# Patient Record
Sex: Male | Born: 1995 | Race: White | Hispanic: No | Marital: Single | Smoking: Current every day smoker
Health system: Southern US, Community
[De-identification: ages and names within clinical notes are randomized; demographics above are authoritative.]

## PROBLEM LIST (undated history)

## (undated) DIAGNOSIS — J45909 Unspecified asthma, uncomplicated: Secondary | ICD-10-CM

## (undated) DIAGNOSIS — F909 Attention-deficit hyperactivity disorder, unspecified type: Secondary | ICD-10-CM

---
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
## 2016-01-20 ENCOUNTER — Emergency Department
Admission: EM | Admit: 2016-01-20 | Discharge: 2016-01-20 | Disposition: A | Discharge status: Home / Self Care | Payer: Medicaid Other | Attending: Emergency Medicine | Admitting: Emergency Medicine

## 2016-01-20 ENCOUNTER — Encounter: Payer: Self-pay | Admitting: Emergency Medicine

## 2016-01-20 DIAGNOSIS — X58XXXA Exposure to other specified factors, initial encounter: Secondary | ICD-10-CM | POA: Diagnosis not present

## 2016-01-20 DIAGNOSIS — T781XXA Other adverse food reactions, not elsewhere classified, initial encounter: Secondary | ICD-10-CM | POA: Diagnosis not present

## 2016-01-20 DIAGNOSIS — F17210 Nicotine dependence, cigarettes, uncomplicated: Secondary | ICD-10-CM | POA: Diagnosis not present

## 2016-01-20 DIAGNOSIS — Y998 Other external cause status: Secondary | ICD-10-CM | POA: Insufficient documentation

## 2016-01-20 DIAGNOSIS — Y9289 Other specified places as the place of occurrence of the external cause: Secondary | ICD-10-CM | POA: Diagnosis not present

## 2016-01-20 DIAGNOSIS — L500 Allergic urticaria: Secondary | ICD-10-CM | POA: Diagnosis not present

## 2016-01-20 DIAGNOSIS — Y9389 Activity, other specified: Secondary | ICD-10-CM | POA: Diagnosis not present

## 2016-01-20 MED ORDER — DIPHENHYDRAMINE HCL 25 MG PO CAPS
25.0000 mg | ORAL_CAPSULE | Freq: Once | ORAL | Status: AC
  Administered 2016-01-20: 25 mg via ORAL
  Filled 2016-01-20: qty 1

## 2016-01-20 MED ORDER — FAMOTIDINE 20 MG PO TABS
20.0000 mg | ORAL_TABLET | Freq: Once | ORAL | Status: AC
  Administered 2016-01-20: 20 mg via ORAL
  Filled 2016-01-20: qty 1

## 2016-01-20 NOTE — ED Notes (Addendum)
Pt says he cooked and ate some crab meat within the last hour; started having hives to his neck and itching; no difficulty swallowing; took 1 tsp benadryl suspension before coming to ED

## 2016-01-26 NOTE — ED Provider Notes (Signed)
-----------------------------------------   5:27 AM on 01/26/2016 -----------------------------------------  I received a notification in [facility] that I needed to do a provider note for this patient, but I never signed up for him and he left without being seen from the lobby.  I never participated directly in his care nor was I aware of his presence in our emergency department.  Loleta Rose, MD 01/26/16 [phone]

## 2016-04-10 ENCOUNTER — Emergency Department
Admission: EM | Admit: 2016-04-10 | Discharge: 2016-04-10 | Disposition: A | Discharge status: Home / Self Care | Payer: Medicaid Other | Attending: Emergency Medicine | Admitting: Emergency Medicine

## 2016-04-10 ENCOUNTER — Encounter: Payer: Self-pay | Admitting: Emergency Medicine

## 2016-04-10 ENCOUNTER — Emergency Department: Payer: Medicaid Other

## 2016-04-10 DIAGNOSIS — F322 Major depressive disorder, single episode, severe without psychotic features: Secondary | ICD-10-CM | POA: Insufficient documentation

## 2016-04-10 DIAGNOSIS — Y999 Unspecified external cause status: Secondary | ICD-10-CM | POA: Insufficient documentation

## 2016-04-10 DIAGNOSIS — S0993XA Unspecified injury of face, initial encounter: Secondary | ICD-10-CM | POA: Diagnosis present

## 2016-04-10 DIAGNOSIS — S0083XA Contusion of other part of head, initial encounter: Secondary | ICD-10-CM

## 2016-04-10 DIAGNOSIS — F17210 Nicotine dependence, cigarettes, uncomplicated: Secondary | ICD-10-CM | POA: Diagnosis not present

## 2016-04-10 DIAGNOSIS — Y929 Unspecified place or not applicable: Secondary | ICD-10-CM | POA: Diagnosis not present

## 2016-04-10 DIAGNOSIS — X500XXA Overexertion from strenuous movement or load, initial encounter: Secondary | ICD-10-CM | POA: Insufficient documentation

## 2016-04-10 DIAGNOSIS — F909 Attention-deficit hyperactivity disorder, unspecified type: Secondary | ICD-10-CM | POA: Diagnosis not present

## 2016-04-10 DIAGNOSIS — Y9389 Activity, other specified: Secondary | ICD-10-CM | POA: Diagnosis not present

## 2016-04-10 MED ORDER — IBUPROFEN 800 MG PO TABS
800.0000 mg | ORAL_TABLET | Freq: Three times a day (TID) | ORAL | Status: AC | PRN
Start: 2016-04-10 — End: ?

## 2016-04-10 NOTE — Discharge Instructions (Signed)

## 2016-04-10 NOTE — ED Provider Notes (Signed)
[facility] Emergency Department Provider Note  ____________________________________________  Time seen: Approximately 4:20 PM  I have reviewed the triage vital signs and the nursing notes.   HISTORY  Chief Complaint Jaw Pain    HPI Jeff Gonzalez is a 20 y.o. male complaining of jaw pain. Patient states he is getting some cinder blocks yesterday and his self on the left side of the jaw with increased pain. He states that he was lifting cinder blocks about 6 at a time when he hit himself in the face. Complains of increased pain to his left lower jaw and when he opens his mouth to chew.   Past Medical History  Diagnosis Date  . ADHD (attention deficit hyperactivity disorder)     Patient Active Problem List   Diagnosis Date Noted  . MDD (major depressive disorder), single episode, severe , no psychosis (HCC) 08/22/2015    History reviewed. No pertinent past surgical history.  Current Outpatient Rx  Name  Route  Sig  Dispense  Refill  . citalopram (CELEXA) 10 MG tablet   Oral   Take 1 tablet (10 mg total) by mouth daily.   30 tablet   0   . ibuprofen (ADVIL,MOTRIN) 800 MG tablet   Oral   Take 1 tablet (800 mg total) by mouth every 8 (eight) hours as needed.   30 tablet   0   . nicotine (NICODERM CQ - DOSED IN MG/24 HOURS) 21 mg/24hr patch   Transdermal   Place 1 patch (21 mg total) onto the skin daily.   28 patch   0     Allergies Augmentin  No family history on file.  Social History Social History  Substance Use Topics  . Smoking status: Current Every Day Smoker    Types: Cigarettes  . Smokeless tobacco: None  . Alcohol Use: No    Review of Systems Constitutional: No fever/chills ENT: Positive for left lower jaw pain.. Skin: Negative for rash. Neurological: Negative for headaches, focal weakness or numbness.  10-point ROS otherwise negative.  ____________________________________________   PHYSICAL EXAM:  VITAL  SIGNS: ED Triage Vitals  Enc Vitals Group     BP 04/10/16 1618 122/68 mmHg     Pulse Rate 04/10/16 1618 78     Resp 04/10/16 1618 18     Temp 04/10/16 1618 98.2 F (36.8 C)     Temp Source 04/10/16 1618 Oral     SpO2 04/10/16 1618 98 %     Weight 04/10/16 1618 174 lb (78.926 kg)     Height 04/10/16 1618  (1.727 m)     Head Cir --      Peak Flow --      Pain Score 04/10/16 1617 6     Pain Loc --      Pain Edu? --      Excl. in GC? --     Constitutional: Alert and oriented. Well appearing and in no acute distress. Mouth/Throat: Mucous membranes are moist.  Oropharynx non-erythematous.Positive tenderness and swelling to left lower jaw. Obvious dental caries within the mouth itself. Neck: No stridor.  FullRange of motion nontender Neurologic:  Normal speech and language. No gross focal neurologic deficits are appreciated. No gait instability. Skin:  Skin is warm, dry and intact. No rash noted. Psychiatric: Mood and affect are normal. Speech and behavior are normal.  ____________________________________________   LABS (all labs ordered are listed, but only abnormal results are displayed)  Labs Reviewed - No data  to display ____________________________________________   RADIOLOGY  IMPRESSION: Incidentally detected moderate chronic appearing right maxillary sinusitis. No acute findings. ____________________________________________   PROCEDURES  Procedure(s) performed: None  Critical Care performed: No  ____________________________________________   INITIAL IMPRESSION / ASSESSMENT AND PLAN / ED COURSE  Pertinent labs & imaging results that were available during my care of the patient were reviewed by me and considered in my medical decision making (see chart for details).  Acute maxillofacial contusion. Reassurance provided to the patient Rx given for ibuprofen 800 mg 3 times a day to take as needed. Patient follow-up with PCP or return when necessary any  worsening symptomology. Work excuse 24 hours given. ____________________________________________   FINAL CLINICAL IMPRESSION(S) / ED DIAGNOSES  Final diagnoses:  Facial contusion, initial encounter     This chart was dictated using voice recognition software/Dragon. Despite best efforts to proofread, errors can occur which can change the meaning. Any change was purely unintentional.   Evangeline Dakinharles M Anjeli Casad, PA-C 04/10/16 1705  Jene Everyobert Kinner, MD 04/10/16 [phone]

## 2016-04-10 NOTE — ED Notes (Signed)
States he was carrying some cinder blocks yesterday and hit self on left side of jaw  Increased pain with chewing no deformity of jaw noted at present

## 2016-08-09 ENCOUNTER — Emergency Department (HOSPITAL_COMMUNITY): Payer: Medicaid Other

## 2016-08-09 ENCOUNTER — Encounter (HOSPITAL_COMMUNITY): Payer: Self-pay | Admitting: Emergency Medicine

## 2016-08-09 DIAGNOSIS — J45909 Unspecified asthma, uncomplicated: Secondary | ICD-10-CM | POA: Diagnosis not present

## 2016-08-09 DIAGNOSIS — F902 Attention-deficit hyperactivity disorder, combined type: Secondary | ICD-10-CM | POA: Diagnosis not present

## 2016-08-09 DIAGNOSIS — Y999 Unspecified external cause status: Secondary | ICD-10-CM | POA: Insufficient documentation

## 2016-08-09 DIAGNOSIS — F17210 Nicotine dependence, cigarettes, uncomplicated: Secondary | ICD-10-CM | POA: Insufficient documentation

## 2016-08-09 DIAGNOSIS — Y929 Unspecified place or not applicable: Secondary | ICD-10-CM | POA: Insufficient documentation

## 2016-08-09 DIAGNOSIS — S0990XA Unspecified injury of head, initial encounter: Secondary | ICD-10-CM | POA: Diagnosis present

## 2016-08-09 DIAGNOSIS — Y939 Activity, unspecified: Secondary | ICD-10-CM | POA: Diagnosis not present

## 2016-08-09 DIAGNOSIS — S0083XA Contusion of other part of head, initial encounter: Secondary | ICD-10-CM | POA: Insufficient documentation

## 2016-08-09 DIAGNOSIS — W208XXA Other cause of strike by thrown, projected or falling object, initial encounter: Secondary | ICD-10-CM | POA: Insufficient documentation
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
## 2016-08-10 ENCOUNTER — Emergency Department (HOSPITAL_COMMUNITY)
Admission: EM | Admit: 2016-08-10 | Discharge: 2016-08-10 | Disposition: A | Discharge status: Home / Self Care | Payer: Medicaid Other | Attending: Emergency Medicine | Admitting: Emergency Medicine

## 2016-08-10 ENCOUNTER — Encounter (HOSPITAL_COMMUNITY): Payer: Self-pay | Admitting: Radiology

## 2016-08-10 ENCOUNTER — Emergency Department (HOSPITAL_COMMUNITY): Payer: Medicaid Other

## 2016-08-10 DIAGNOSIS — S0083XA Contusion of other part of head, initial encounter: Secondary | ICD-10-CM

## 2016-08-10 HISTORY — DX: Unspecified asthma, uncomplicated: J45.909

## 2016-08-10 MED ORDER — TRAMADOL HCL 50 MG PO TABS
50.0000 mg | ORAL_TABLET | Freq: Once | ORAL | Status: AC
  Administered 2016-08-10: 50 mg via ORAL
  Filled 2016-08-10: qty 1

## 2016-08-10 MED ORDER — NAPROXEN 500 MG PO TABS: 500.0000 mg | ORAL_TABLET | Freq: Two times a day (BID) | ORAL | 0 refills | Status: AC

## 2016-08-10 MED ORDER — OXYCODONE-ACETAMINOPHEN 5-325 MG PO TABS: 1.0000 | ORAL_TABLET | Freq: Four times a day (QID) | ORAL | 0 refills | Status: AC | PRN

## 2016-08-10 NOTE — ED Notes (Signed)
Family at bedside. 
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
## 2016-08-10 NOTE — ED Notes (Signed)
Patient is alert and orientedx4.  Patient was explained discharge instructions and they understood them with no questions.  The patient's fiancee, Adrian PrinceBryana Cairo is taking the patient home.

## 2016-08-10 NOTE — ED Notes (Signed)
Pt refusing CT because his wife can't be in the CT scanner with him. Pt walked outside with wife. Did not inform staff if he was refusing anymore treatment.

## 2016-10-27 IMAGING — CT CT MAXILLOFACIAL W/O CM
3 series · 16 of 47 positions shown, 19 images · non-contrast
Comparison: None.

CLINICAL DATA: carrying some cinder blocks yesterday and hit self
on left side of jaw Increased pain with chewing no deformity of jaw
noted at present.

EXAM:
CT MAXILLOFACIAL WITHOUT CONTRAST
TECHNIQUE: Multidetector CT imaging of the maxillofacial structures was
performed. Multiplanar CT image reconstructions were also generated.
A small metallic BB was placed on the right temple in order to
reliably differentiate right from left.

[Series 2: max soft · axial · 0.35mm/px · z∈[-68,+70]mm · 10 of 81 slices shown, 13 images]
[im 6/81  brain]
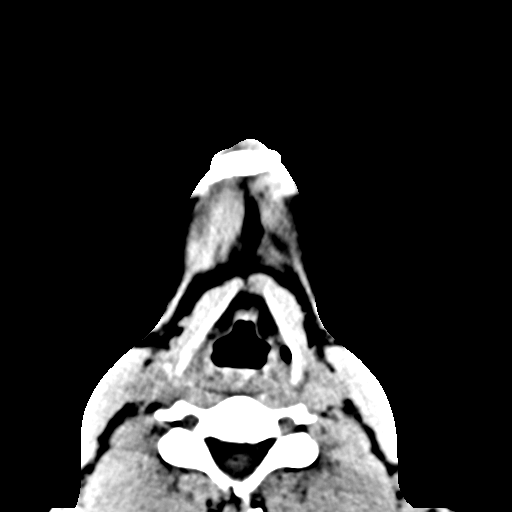
[im 6/81  bone]
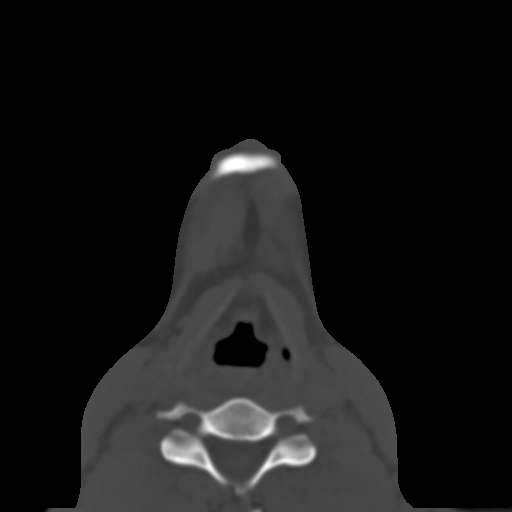
[im 14/81  bone]
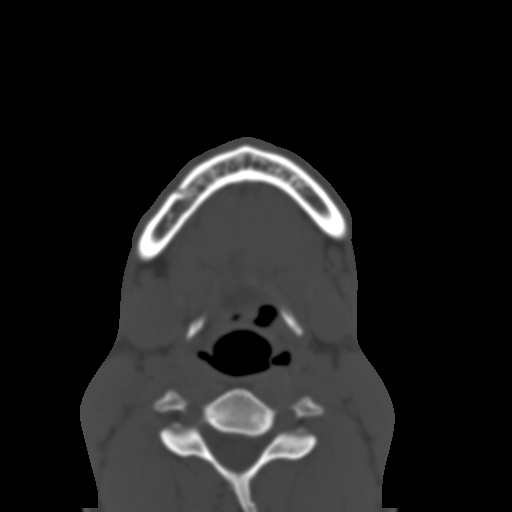
[im 23/81  bone]
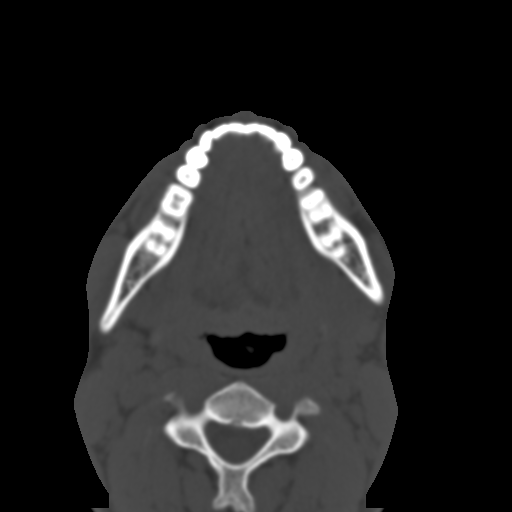
[im 28/81  bone]
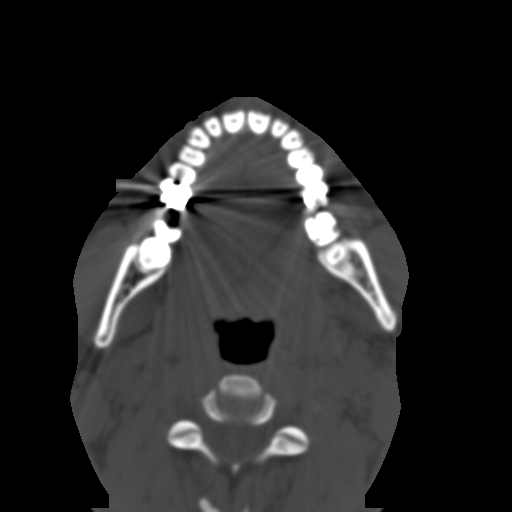
[im 36/81  brain]
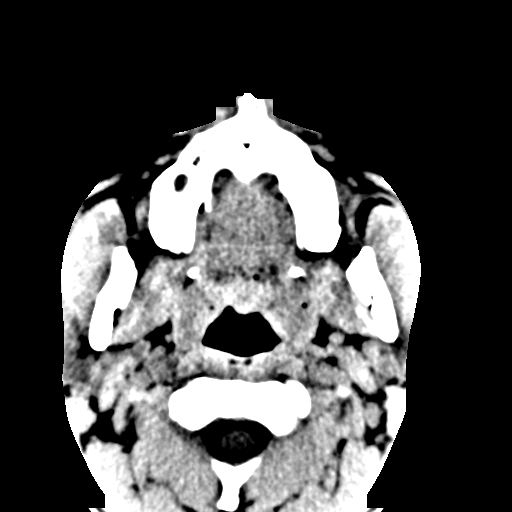
[im 36/81  bone]
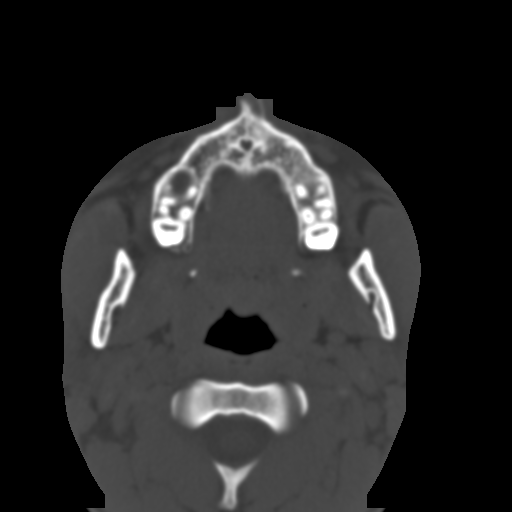
[im 45/81  bone]
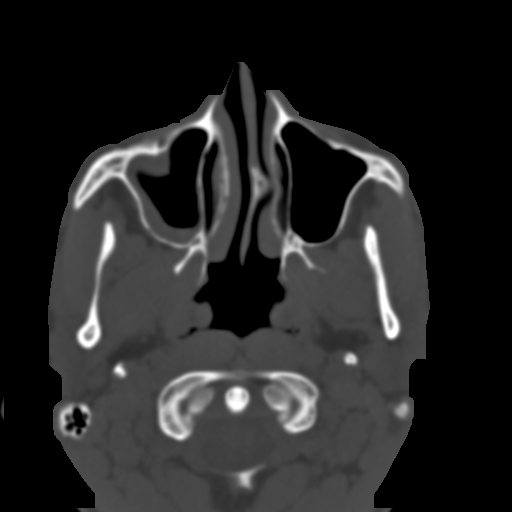
[im 53/81  bone]
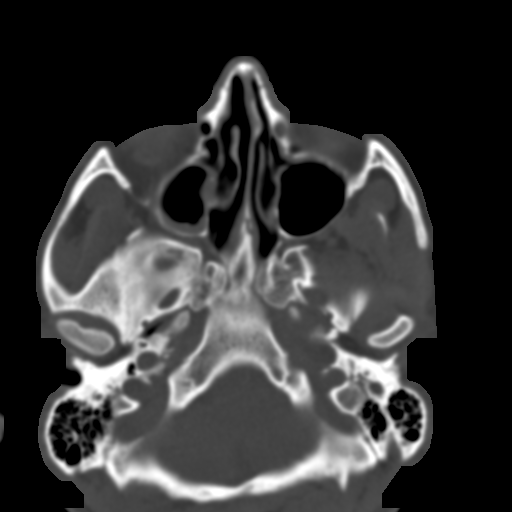
[im 61/81  bone]
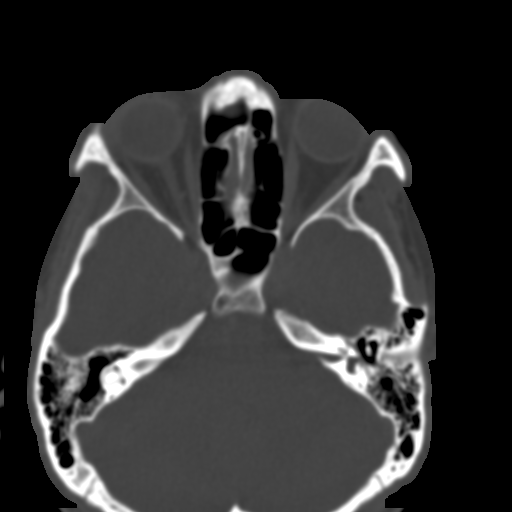
[im 67/81  brain]
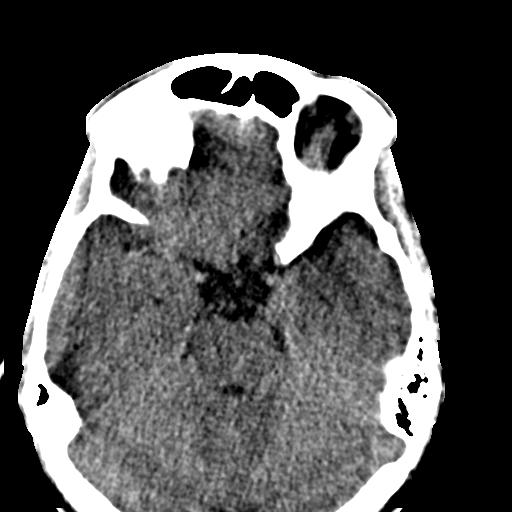
[im 67/81  bone]
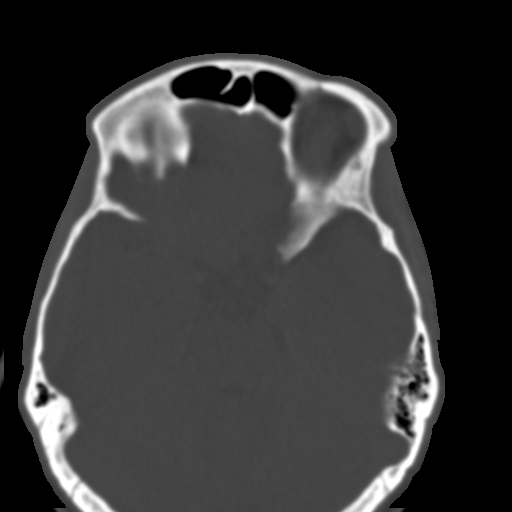
[im 75/81  bone]
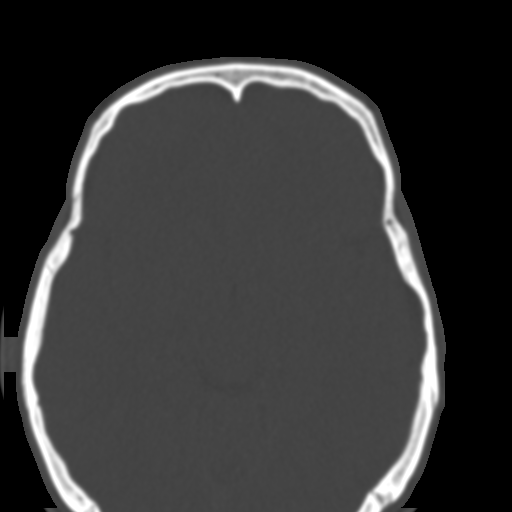

[Series 4: coronal soft · coronal · 0.33mm/px · 3 of 83 slices shown]
[im 28/83  bone]
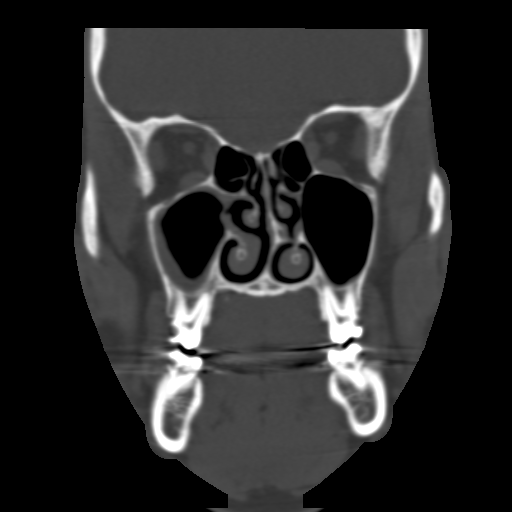
[im 37/83  bone]
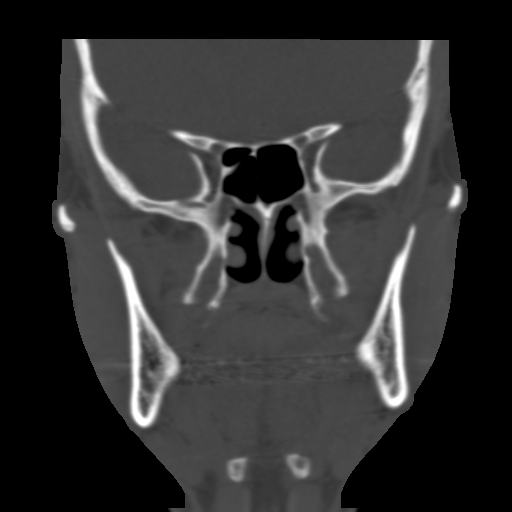
[im 46/83  bone]
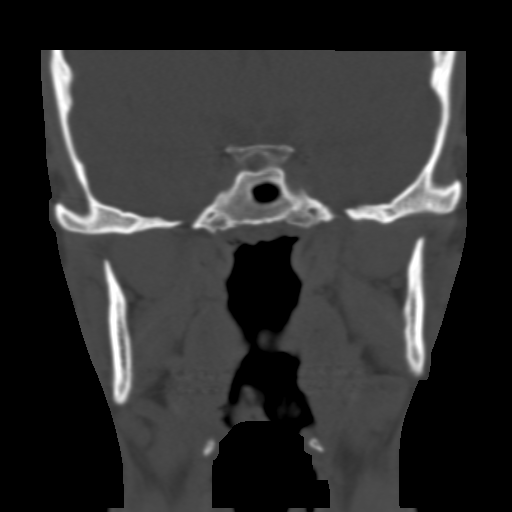

[Series 5: sagittal soft · sagittal · 0.33mm/px · 3 of 79 slices shown]
[im 27/79  bone]
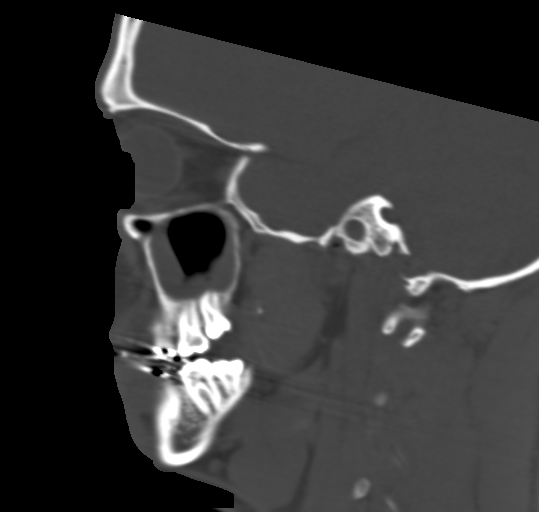
[im 40/79  bone]
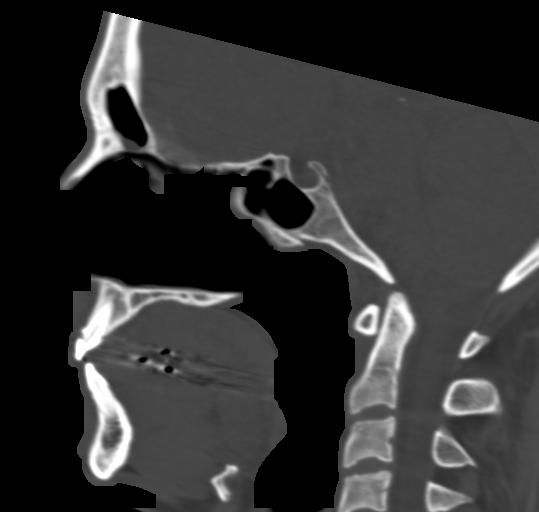
[im 53/79  bone]
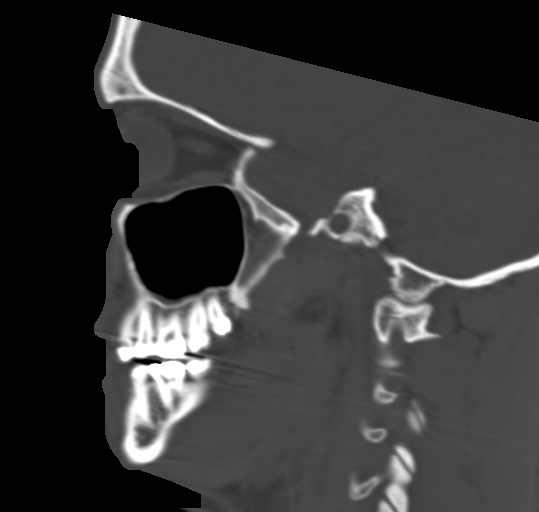

[16 of 47 positions shown; findings below may reference images not displayed]

FINDINGS: Moderate inflammatory change right maxillary sinuses not appear
acute. Remainder of the sinuses appear clear. No evidence of
mandibular fracture. Zygomas are intact. Both orbits are intact. No
evidence nasal bone fracture. No acute soft tissue abnormalities.
IMPRESSION: Incidentally detected moderate chronic appearing right maxillary
sinusitis. No acute findings.

## 2017-01-01 ENCOUNTER — Encounter (HOSPITAL_COMMUNITY): Payer: Self-pay

## 2017-01-01 ENCOUNTER — Emergency Department (HOSPITAL_COMMUNITY): Payer: Medicaid Other

## 2017-01-01 ENCOUNTER — Emergency Department (HOSPITAL_COMMUNITY)
Admission: EM | Admit: 2017-01-01 | Discharge: 2017-01-02 | Disposition: A | Discharge status: Home / Self Care | Payer: Medicaid Other | Attending: Emergency Medicine | Admitting: Emergency Medicine

## 2017-01-01 DIAGNOSIS — F909 Attention-deficit hyperactivity disorder, unspecified type: Secondary | ICD-10-CM | POA: Insufficient documentation

## 2017-01-01 DIAGNOSIS — K2970 Gastritis, unspecified, without bleeding: Secondary | ICD-10-CM | POA: Insufficient documentation

## 2017-01-01 DIAGNOSIS — J45909 Unspecified asthma, uncomplicated: Secondary | ICD-10-CM | POA: Diagnosis not present

## 2017-01-01 DIAGNOSIS — F17210 Nicotine dependence, cigarettes, uncomplicated: Secondary | ICD-10-CM | POA: Insufficient documentation

## 2017-01-01 DIAGNOSIS — K2900 Acute gastritis without bleeding: Secondary | ICD-10-CM

## 2017-01-01 DIAGNOSIS — R1013 Epigastric pain: Secondary | ICD-10-CM | POA: Diagnosis present

## 2017-01-01 LAB — COMPREHENSIVE METABOLIC PANEL
ALBUMIN: 3.9 g/dL (ref 3.5–5.0)
ALT: 20 U/L (ref 17–63)
AST: 23 U/L (ref 15–41)
Alkaline Phosphatase: 71 U/L (ref 38–126)
Anion gap: 7 (ref 5–15)
BUN: 7 mg/dL (ref 6–20)
CHLORIDE: 107 mmol/L (ref 101–111)
CO2: 26 mmol/L (ref 22–32)
Calcium: 8.7 mg/dL — ABNORMAL LOW (ref 8.9–10.3)
Creatinine, Ser: 0.94 mg/dL (ref 0.61–1.24)
GFR calc Af Amer: >60 mL/min (ref >60)
GFR calc non Af Amer: >60 mL/min (ref >60)
GLUCOSE: 103 mg/dL — AB (ref 65–99)
POTASSIUM: 4 mmol/L (ref 3.5–5.1)
Sodium: 140 mmol/L (ref 135–145)
Total Bilirubin: 0.4 mg/dL (ref 0.3–1.2)
Total Protein: 6.4 g/dL — ABNORMAL LOW (ref 6.5–8.1)

## 2017-01-01 LAB — CBC
HEMATOCRIT: 40 % (ref 39.0–52.0)
Hemoglobin: 13.4 g/dL (ref 13.0–17.0)
MCH: 28.8 pg (ref 26.0–34.0)
MCHC: 33.5 g/dL (ref 30.0–36.0)
MCV: 86 fL (ref 78.0–100.0)
Platelets: 153 10*3/uL (ref 150–400)
RBC: 4.65 MIL/uL (ref 4.22–5.81)
RDW: 13.5 % (ref 11.5–15.5)
WBC: 3.8 10*3/uL — AB (ref 4.0–10.5)

## 2017-01-01 LAB — URINALYSIS, ROUTINE W REFLEX MICROSCOPIC
Bilirubin Urine: NEGATIVE
GLUCOSE, UA: NEGATIVE mg/dL
Hgb urine dipstick: NEGATIVE
KETONES UR: NEGATIVE mg/dL
LEUKOCYTES UA: NEGATIVE
Nitrite: NEGATIVE
PH: 6 (ref 5.0–8.0)
Protein, ur: NEGATIVE mg/dL
Specific Gravity, Urine: 1.024 (ref 1.005–1.030)

## 2017-01-01 LAB — LIPASE, BLOOD: LIPASE: 24 U/L (ref 11–51)

## 2017-01-01 LAB — RAPID STREP SCREEN (MED CTR MEBANE ONLY): Streptococcus, Group A Screen (Direct): NEGATIVE

## 2017-01-01 MED ORDER — ONDANSETRON 4 MG PO TBDP
4.0000 mg | ORAL_TABLET | Freq: Once | ORAL | Status: AC
  Administered 2017-01-01: 4 mg via ORAL
  Filled 2017-01-01: qty 1

## 2017-01-01 MED ORDER — GI COCKTAIL ~~LOC~~
30.0000 mL | Freq: Once | ORAL | Status: AC
  Administered 2017-01-01: 30 mL via ORAL
  Filled 2017-01-01: qty 30

## 2017-01-01 MED ORDER — ONDANSETRON HCL 4 MG PO TABS: 4.0000 mg | ORAL_TABLET | Freq: Four times a day (QID) | ORAL | 0 refills | Status: AC

## 2017-01-01 MED ORDER — OXYCODONE-ACETAMINOPHEN 5-325 MG PO TABS
1.0000 | ORAL_TABLET | Freq: Once | ORAL | Status: AC
  Administered 2017-01-01: 1 via ORAL
  Filled 2017-01-01: qty 1

## 2017-01-01 MED ORDER — OMEPRAZOLE 20 MG PO CPDR: 20.0000 mg | DELAYED_RELEASE_CAPSULE | Freq: Every day | ORAL | 0 refills | Status: AC

## 2017-01-01 NOTE — ED Notes (Signed)
Patient transported to X-ray 

## 2017-01-01 NOTE — ED Notes (Signed)
Pt. returned from XR. 

## 2017-01-01 NOTE — ED Notes (Addendum)
Pt denies N/V/D or bloody stools but reports one episode of hemoptysis ~ 2000 tonight. Pt reports moderate amount of bright red blood.

## 2017-01-01 NOTE — ED Provider Notes (Signed)
[facility] Provider Note   CSN: [id number] Arrival date & time: 01/01/17  2005     History   Chief Complaint Chief Complaint  Patient presents with  . Abdominal Pain  . Hematemesis    HPI Jeff Gonzalez is a 21 y.o. male.  The history is provided by the patient. No language interpreter was used.   Jeff Gonzalez is a 21 y.o. male who presents to the Emergency Department complaining of Abdominal pain and vomiting. He reports 3 days if sharp and stabbing epigastric abdominal pain that radiates throughout his abdomen. Pain is constant but worse with walking and eating. Today he had a coughing episode and coughed up a small volume of blood that was bright red, no clots or mucus mixed in. The amount of blood filled the palm of her hand. Eyes any vomiting or diarrhea. No melena or hematochezia. No hematuria. He is a smoker and has a history history of asthma, no other medical problems. He has taken Tylenol, ibuprofen and Goody powders for the pain. He does not drink alcohol or do street drugs. Past Medical History:  Diagnosis Date  . ADHD (attention deficit hyperactivity disorder)   . Asthma     Patient Active Problem List   Diagnosis Date Noted  . MDD (major depressive disorder), single episode, severe , no psychosis (HCC) 08/22/2015    History reviewed. No pertinent surgical history.     Home Medications    Prior to Admission medications   Medication Sig Start Date End Date Taking? Authorizing Provider  ibuprofen (ADVIL,MOTRIN) 800 MG tablet Take 1 tablet (800 mg total) by mouth every 8 (eight) hours as needed. Patient not taking: Reported on 08/10/2016 04/10/16   Charmayne Sheerharles M Beers, PA-C  naproxen (NAPROSYN) 500 MG tablet Take 1 tablet (500 mg total) by mouth 2 (two) times daily. 08/10/16   Tiffany Neva SeatGreene, PA-C  omeprazole (PRILOSEC) 20 MG capsule Take 1 capsule (20 mg total) by mouth daily. 01/01/17   Tilden FossaElizabeth Fannie Gathright, MD  ondansetron (ZOFRAN) 4 MG tablet Take 1 tablet  (4 mg total) by mouth every 6 (six) hours. 01/01/17   Tilden FossaElizabeth Brenae Lasecki, MD  oxyCODONE-acetaminophen (PERCOCET/ROXICET) 5-325 MG tablet Take 1 tablet by mouth every 6 (six) hours as needed for severe pain. 08/10/16   Marlon Peliffany Greene, PA-C    Family History History reviewed. No pertinent family history.  Social History Social History  Substance Use Topics  . Smoking status: Current Every Day Smoker    Packs/day: 1.00    Types: Cigarettes  . Smokeless tobacco: Never Used  . Alcohol use No     Allergies   Augmentin [amoxicillin-pot clavulanate]   Review of Systems Review of Systems  All other systems reviewed and are negative.    Physical Exam Updated Vital Signs BP 106/70   Pulse 73   Temp 97.6 F (36.4 C) (Oral)   Resp 18   Ht 5\' 9"  (1.753 m)   Wt 172 lb (78 kg)   SpO2 98%   BMI 25.40 kg/m   Physical Exam  Constitutional: He is oriented to person, place, and time. He appears well-developed and well-nourished.  HENT:  Head: Normocephalic and atraumatic.  Mouth/Throat: Oropharynx is clear and moist.  Boggy nasal mucosa, mild erythema in posterior oropharynx, small amount of white exudate in posterior oropharynx.  Neck: Neck supple.  Cardiovascular: Normal rate and regular rhythm.   No murmur heard. Pulmonary/Chest: Effort normal and breath sounds normal. No respiratory distress.  Abdominal: Soft. There is  no rebound and no guarding.  Mild epigastric abdominal tenderness  Musculoskeletal: He exhibits no edema or tenderness.  Neurological: He is alert and oriented to person, place, and time.  Skin: Skin is warm and dry.  Psychiatric: He has a normal mood and affect. His behavior is normal.  Nursing note and vitals reviewed.    ED Treatments / Results  Labs (all labs ordered are listed, but only abnormal results are displayed) Labs Reviewed  COMPREHENSIVE METABOLIC PANEL - Abnormal; Notable for the following:       Result Value   Glucose, Bld 103 (*)    Calcium  8.7 (*)    Total Protein 6.4 (*)    All other components within normal limits  CBC - Abnormal; Notable for the following:    WBC 3.8 (*)    All other components within normal limits  URINALYSIS, ROUTINE W REFLEX MICROSCOPIC - Abnormal; Notable for the following:    APPearance HAZY (*)    All other components within normal limits  RAPID STREP SCREEN (NOT AT [facility])  CULTURE, GROUP A STREP ([facility])  LIPASE, BLOOD    EKG  EKG Interpretation None       Radiology Dg Chest 2 View  Result Date: 01/01/2017 CLINICAL DATA:  Initial evaluation for acute vomiting, cough. EXAM: CHEST  2 VIEW COMPARISON:  None. FINDINGS: The heart size and mediastinal contours are within normal limits. Both lungs are clear. The visualized skeletal structures are unremarkable. IMPRESSION: No active cardiopulmonary disease. Electronically Signed   By: Rise Mu M.D.   On: 01/01/2017 23:13    Procedures Procedures (including critical care time)  Medications Ordered in ED Medications  ondansetron (ZOFRAN-ODT) disintegrating tablet 4 mg (4 mg Oral Given 01/01/17 2253)  gi cocktail (Maalox,Lidocaine,Donnatal) (30 mLs Oral Given 01/01/17 2254)  oxyCODONE-acetaminophen (PERCOCET/ROXICET) 5-325 MG per tablet 1 tablet (1 tablet Oral Given 01/01/17 2253)     Initial Impression / Assessment and Plan / ED Course  I have reviewed the triage vital signs and the nursing notes.  Pertinent labs & imaging results that were available during my care of the patient were reviewed by me and considered in my medical decision making (see chart for details).     Patient here for evaluation of abdominal pain with one episode of bloody emesis/hemoptysis. He has mild epigastric tenderness on examination with no peritoneal findings. Clinical picture is not consistent with PE, Boerhaave's esophagus, perforated viscus, cholecystitis, appendicitis. Counseled patient and family on likely gastritis. Will treat with omeprazole,  antiemetics, gentle diet with close outpatient follow-up and return precautions. Discussed the importance of close return precautions if he has worsening bleeding or worsening pain.  Final Clinical Impressions(s) / ED Diagnoses   Final diagnoses:  Acute gastritis, presence of bleeding unspecified, unspecified gastritis type    New Prescriptions Discharge Medication List as of 01/01/2017 11:54 PM    START taking these medications   Details  omeprazole (PRILOSEC) 20 MG capsule Take 1 capsule (20 mg total) by mouth daily., Starting Thu 01/01/2017, Print    ondansetron (ZOFRAN) 4 MG tablet Take 1 tablet (4 mg total) by mouth every 6 (six) hours., Starting Thu 01/01/2017, Print         Tilden Fossa, MD 01/02/17 [phone]

## 2017-01-01 NOTE — ED Triage Notes (Addendum)
Pt endorses abdominal pain x 4 days and states "I cough really hard today and spit up some bright red blood" Pt denies n/v/d. Pt took tramadol pta for pain and has been taking ibuprofen/tylenold without relief.

## 2017-01-03 LAB — CULTURE, GROUP A STREP (THRC)

## 2017-06-13 ENCOUNTER — Emergency Department
Admission: EM | Admit: 2017-06-13 | Discharge: 2017-06-14 | Disposition: A | Discharge status: Home / Self Care | Payer: Medicaid Other | Attending: Emergency Medicine | Admitting: Emergency Medicine

## 2017-06-13 ENCOUNTER — Encounter: Payer: Self-pay | Admitting: Emergency Medicine

## 2017-06-13 DIAGNOSIS — Z79899 Other long term (current) drug therapy: Secondary | ICD-10-CM | POA: Diagnosis not present

## 2017-06-13 DIAGNOSIS — F339 Major depressive disorder, recurrent, unspecified: Secondary | ICD-10-CM | POA: Insufficient documentation

## 2017-06-13 DIAGNOSIS — J45909 Unspecified asthma, uncomplicated: Secondary | ICD-10-CM | POA: Diagnosis not present

## 2017-06-13 DIAGNOSIS — F17210 Nicotine dependence, cigarettes, uncomplicated: Secondary | ICD-10-CM | POA: Diagnosis not present

## 2017-06-13 DIAGNOSIS — F329 Major depressive disorder, single episode, unspecified: Secondary | ICD-10-CM

## 2017-06-13 DIAGNOSIS — Z791 Long term (current) use of non-steroidal anti-inflammatories (NSAID): Secondary | ICD-10-CM | POA: Insufficient documentation

## 2017-06-13 DIAGNOSIS — F32A Depression, unspecified: Secondary | ICD-10-CM

## 2017-06-13 LAB — CBC
HEMATOCRIT: 42.5 % (ref 40.0–52.0)
Hemoglobin: 14.7 g/dL (ref 13.0–18.0)
MCH: 29.8 pg (ref 26.0–34.0)
MCHC: 34.5 g/dL (ref 32.0–36.0)
MCV: 86.3 fL (ref 80.0–100.0)
Platelets: 225 10*3/uL (ref 150–440)
RBC: 4.93 MIL/uL (ref 4.40–5.90)
RDW: 13.9 % (ref 11.5–14.5)
WBC: 6.5 10*3/uL (ref 3.8–10.6)

## 2017-06-13 LAB — COMPREHENSIVE METABOLIC PANEL
ALBUMIN: 4.4 g/dL (ref 3.5–5.0)
ALK PHOS: 68 U/L (ref 38–126)
ALT: 12 U/L — AB (ref 17–63)
AST: 22 U/L (ref 15–41)
Anion gap: 8 (ref 5–15)
BILIRUBIN TOTAL: 0.5 mg/dL (ref 0.3–1.2)
BUN: 9 mg/dL (ref 6–20)
CO2: 26 mmol/L (ref 22–32)
Calcium: 8.9 mg/dL (ref 8.9–10.3)
Chloride: 105 mmol/L (ref 101–111)
Creatinine, Ser: 0.84 mg/dL (ref 0.61–1.24)
GFR calc Af Amer: >60 mL/min (ref >60)
GFR calc non Af Amer: >60 mL/min (ref >60)
GLUCOSE: 88 mg/dL (ref 65–99)
POTASSIUM: 3.7 mmol/L (ref 3.5–5.1)
Sodium: 139 mmol/L (ref 135–145)
TOTAL PROTEIN: 7.5 g/dL (ref 6.5–8.1)

## 2017-06-13 LAB — ETHANOL: Alcohol, Ethyl (B): <5 mg/dL (ref <5)

## 2017-06-13 LAB — SALICYLATE LEVEL: Salicylate Lvl: <7 mg/dL (ref 2.8–30.0)

## 2017-06-13 LAB — ACETAMINOPHEN LEVEL: Acetaminophen (Tylenol), Serum: 11 ug/mL (ref 10–30)

## 2017-06-13 MED ORDER — BUPROPION HCL ER (XL) 150 MG PO TB24
150.0000 mg | ORAL_TABLET | ORAL | Status: DC
  Administered 2017-06-14: 150 mg via ORAL
  Filled 2017-06-13: qty 1

## 2017-06-13 MED ORDER — TRAZODONE HCL 50 MG PO TABS: 50.0000 mg | ORAL_TABLET | Freq: Every evening | ORAL | Status: DC | PRN

## 2017-06-13 NOTE — ED Notes (Signed)

## 2017-06-13 NOTE — ED Triage Notes (Signed)
Pt admits to SI with plan of hanging self. Admits to cutting wrists earlier today; abrasion to left arm and hand..  Pt also punched glass mirror earlier today. Unsure last Tdap.  732 month old son passed away almost 2 years ago from SIDS.  Denies HI/hallucinations.

## 2017-06-13 NOTE — ED Notes (Signed)
RN cleaned small cut to pt's left arm. RN asked how it happened and Pt stated he cut himself because he is depressed and thinking about his deceased son. Pt told RN that after he did it he decided to come here to get some help before it "went too far".

## 2017-06-13 NOTE — ED Notes (Signed)
Report given to Minerva AreolaEric, RN in ED [facility].
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
## 2017-06-13 NOTE — ED Notes (Signed)
Pt dressed out, triage rm 2.  Socks, boots, boxers, shirt, pants with belt, chain necklace with ring, multiple rubber arm bands, 1 chain bracelet, lip ring, 2 ear rings, eye brow ring

## 2017-06-14 MED ORDER — FLUOXETINE HCL 10 MG PO CAPS: 10.0000 mg | ORAL_CAPSULE | Freq: Every day | ORAL | 0 refills | Status: AC

## 2017-06-14 NOTE — ED Notes (Signed)
Pt discharged to lobby. Pt was stable and appreciative at that time. All papers and prescriptions were given and valuables returned. Verbal understanding expressed. Denies SI/HI and A/VH. Pt given opportunity to express concerns and ask questions.  

## 2017-06-14 NOTE — BH Assessment (Signed)
Patient has been accepted to [facility].  Patient assigned to room TBA Accepting physician is Dr. Rocky MorelWagn.  Call report to [phone] or [phone].  Representative was [employer].  ER Staff is aware of it (ER Sect. Manuela SchwartzAmanda; ,  Eric Patient's Nurse)    Patient [facility] after 930 AM.

## 2017-06-14 NOTE — ED Notes (Signed)
Pt alert and oriented on admission. Pt mood is depressed but he is pleasant and cooperative with staff. Writer discussed tx plan, provided nutrition and 15 minute checks ongoing for safety.

## 2017-06-14 NOTE — ED Provider Notes (Signed)
-----------------------------------------   7:16 AM on 06/14/2017 -----------------------------------------   Blood pressure 107/60, pulse 72, temperature 98.1 F (36.7 C), temperature source Oral, resp. rate 16, height 5\' 8"  (1.727 m), weight 84.8 kg (187 lb), SpO2 97 %.  The patient had no acute events since last update.  Calm and cooperative at this time.  Disposition is pending Psychiatry/Behavioral Medicine team recommendations.     Irean HongSung, Jade J, MD 06/14/17 [phone]

## 2017-06-14 NOTE — ED Provider Notes (Signed)
Patient seen by psychiatry. Cleared for discharge. Providing prescription for Prozac with plan to follow-up with [facility].     Sharyn CreamerQuale, Petrona Wyeth, MD 06/14/17 1056

## 2017-06-14 NOTE — BH Assessment (Signed)
[facility] about [facility]  She stated they did not have any male beds for depression.

## 2017-06-14 NOTE — Discharge Instructions (Signed)

## 2017-06-14 NOTE — ED Notes (Signed)
Patient brought breakfast. 

## 2017-06-14 NOTE — ED Provider Notes (Signed)
Went to reevaluate the patient with plan to [facility], patient reports that he is no longer suicidal. He tells me that he does not have any thoughts of harming himself that he's had time to "think and work through things."  Tells me he is not edition [facility] and that he heard that he might be able to start some depression medicine here for a couple days and then wanted field go home. Reports he does not want to [facility], and tells me he is no longer suicidal.  He is currently under involuntary commitment. I have canceled [facility] and instead placed a reconsultation to  psychiatry to reassess his change in symptomatology.   Sharyn CreamerQuale, Mark, MD 06/14/17 [phone]

## 2017-06-14 NOTE — BH Assessment (Signed)
Referral information for Psychiatric Hospitalization faxed to;      [facility] ([phone]),      [facility] ([phone]),      [facility] ([phone]),     

## 2017-06-14 NOTE — ED Notes (Addendum)
Patient easily aroused from sleep for vital signs.Pt informed of [facility]. Pt denies SI/HI at this time. Pt requesting to go home stating, "I came in voluntarily , I didn't think I'd have to go out of town." ED Dr notified . Pt remains safe with 15 minute checks.

## 2017-07-15 ENCOUNTER — Encounter: Payer: Self-pay | Admitting: *Deleted

## 2017-07-15 ENCOUNTER — Emergency Department
Admission: EM | Admit: 2017-07-15 | Discharge: 2017-07-15 | Disposition: A | Discharge status: Home / Self Care | Payer: Medicaid Other | Attending: Emergency Medicine | Admitting: Emergency Medicine

## 2017-07-15 DIAGNOSIS — F1910 Other psychoactive substance abuse, uncomplicated: Secondary | ICD-10-CM | POA: Diagnosis not present

## 2017-07-15 DIAGNOSIS — Z79899 Other long term (current) drug therapy: Secondary | ICD-10-CM | POA: Diagnosis not present

## 2017-07-15 DIAGNOSIS — J45909 Unspecified asthma, uncomplicated: Secondary | ICD-10-CM | POA: Diagnosis not present

## 2017-07-15 DIAGNOSIS — F17210 Nicotine dependence, cigarettes, uncomplicated: Secondary | ICD-10-CM | POA: Diagnosis not present

## 2017-07-15 DIAGNOSIS — Z029 Encounter for administrative examinations, unspecified: Secondary | ICD-10-CM | POA: Diagnosis present

## 2017-07-15 NOTE — ED Provider Notes (Signed)
[facility] Emergency Department Provider Note   ____________________________________________   I have reviewed the triage vital signs and the nursing notes.   HISTORY  Chief Complaint Addiction Problem   History limited by: Not Limited   HPI Jeff Gonzalez is a 21 y.o. male who presents to the emergency department today because he desires to detox. 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 He is interested in a residential program. He denies any chest pain, shortness breath. Denies any fevers today.   Past Medical History:  Diagnosis Date  . ADHD (attention deficit hyperactivity disorder)   . Asthma     Patient Active Problem List   Diagnosis Date Noted  . MDD (major depressive disorder), single episode, severe , no psychosis (HCC) 08/22/2015    History reviewed. No pertinent surgical history.  Prior to Admission medications   Medication Sig Start Date End Date Taking? Authorizing Provider  FLUoxetine (PROZAC) 10 MG capsule Take 1 capsule (10 mg total) by mouth daily. 06/14/17 06/14/18  Sharyn CreamerQuale, Mark, MD  ibuprofen (ADVIL,MOTRIN) 800 MG tablet Take 1 tablet (800 mg total) by mouth every 8 (eight) hours as needed. Patient not taking: Reported on 08/10/2016 04/10/16   Evangeline DakinBeers, Charles M, PA-C  naproxen (NAPROSYN) 500 MG tablet Take 1 tablet (500 mg total) by mouth 2 (two) times daily. Patient not taking: Reported on 06/14/2017 08/10/16   Marlon PelGreene, Tiffany, PA-C  omeprazole (PRILOSEC) 20 MG capsule Take 1 capsule (20 mg total) by mouth daily. Patient not taking: Reported on 06/14/2017 01/01/17   Tilden Fossaees, Elizabeth, MD  ondansetron (ZOFRAN) 4 MG tablet Take 1 tablet (4 mg total) by mouth every 6 (six) hours. Patient not taking: Reported on 06/14/2017 01/01/17   Tilden Fossaees, Elizabeth, MD  oxyCODONE-acetaminophen (PERCOCET/ROXICET) 5-325 MG tablet Take 1 tablet by mouth every 6 (six) hours as  needed for severe pain. Patient not taking: Reported on 06/14/2017 08/10/16   Marlon PelGreene, Tiffany, PA-C    Allergies Augmentin [amoxicillin-pot clavulanate]  History reviewed. No pertinent family history.  Social History Social History  Substance Use Topics  . Smoking status: Current Every Day Smoker    Packs/day: 1.00    Types: Cigarettes  . Smokeless tobacco: Never Used  . Alcohol use No    Review of Systems Constitutional: No fever/chills Eyes: No visual changes. ENT: No sore throat. Cardiovascular: Denies chest pain. Respiratory: Denies shortness of breath. Gastrointestinal: No abdominal pain.  No nausea, no vomiting.  No diarrhea.   Genitourinary: Negative for dysuria. Musculoskeletal: Negative for back pain. Skin: Negative for rash. Neurological: Negative for headaches, focal weakness or numbness.  ____________________________________________   PHYSICAL EXAM:  VITAL SIGNS: ED Triage Vitals  Enc Vitals Group     BP 07/15/17 2146 (!) 145/90     Pulse Rate 07/15/17 2146 (!) 111     Resp 07/15/17 2146 16     Temp 07/15/17 2146 98.2 F (36.8 C)     Temp Source 07/15/17 2146 Oral     SpO2 07/15/17 2146 96 %     Weight 07/15/17 2147 187 lb (84.8 kg)     Height 07/15/17 2147 5\' 8"  (1.727 m)   Constitutional: Alert and oriented. Well appearing and in no distress. Eyes: Conjunctivae are normal.  ENT   Head: Normocephalic and atraumatic.   Nose: No congestion/rhinnorhea.   Mouth/Throat: Mucous membranes are moist.   Neck: No stridor. Hematological/Lymphatic/Immunilogical:  No cervical lymphadenopathy. Cardiovascular: Normal rate, regular rhythm.  No murmurs, rubs, or gallops.  Respiratory: Normal respiratory effort without tachypnea nor retractions. Breath sounds are clear and equal bilaterally. No wheezes/rales/rhonchi. Gastrointestinal: Soft and non tender. No rebound. No guarding.  Genitourinary: Deferred Musculoskeletal: Normal range of motion in all  extremities. No lower extremity edema. Neurologic:  Normal speech and language. No gross focal neurologic deficits are appreciated.  Skin:  Skin is warm, dry and intact. No rash noted. Psychiatric: Mood and affect are normal. Speech and behavior are normal. Patient exhibits appropriate insight and judgment.  ____________________________________________    LABS (pertinent positives/negatives)  None  ____________________________________________   EKG  I, Phineas Semen, attending physician, personally viewed and interpreted this EKG  EKG Time: 2149 Rate: 92 Rhythm: normal sinus rhythm Axis: normal Intervals: qtc 410 QRS: narrow ST changes: no st elevation Impression: normal ekg   ____________________________________________    RADIOLOGY  None  ____________________________________________   PROCEDURES  Procedures  ____________________________________________   INITIAL IMPRESSION / ASSESSMENT AND PLAN / ED COURSE  Pertinent labs & imaging results that were available during my care of the patient were reviewed by me and considered in my medical decision making (see chart for details).  Patient presented to the emergency department today because of desires to detox from crystal meth. I discussed patient that we do not do detox here in the hospital. Patient was receptive to both [facility] in R[facility] information. Discussed with patient and encouraged patient to follow up with doses of these facilities.  ____________________________________________   FINAL CLINICAL IMPRESSION(S) / ED DIAGNOSES  Final diagnoses:  Substance abuse     Note: This dictation was prepared with Dragon dictation. Any transcriptional errors that result from this process are unintentional     Phineas Semen, MD 07/15/17 2230

## 2017-07-15 NOTE — Discharge Instructions (Signed)
Please seek medical attention for any high fevers, chest pain, shortness of breath, change in behavior, persistent vomiting, bloody stool or any other new or concerning symptoms.  
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
## 2017-07-20 IMAGING — CR DG CHEST 2V
2 series · 2 of 2 positions shown · non-contrast
Comparison: None.

CLINICAL DATA: Initial evaluation for acute vomiting, cough.

EXAM:
CHEST  2 VIEW

[chest pa]
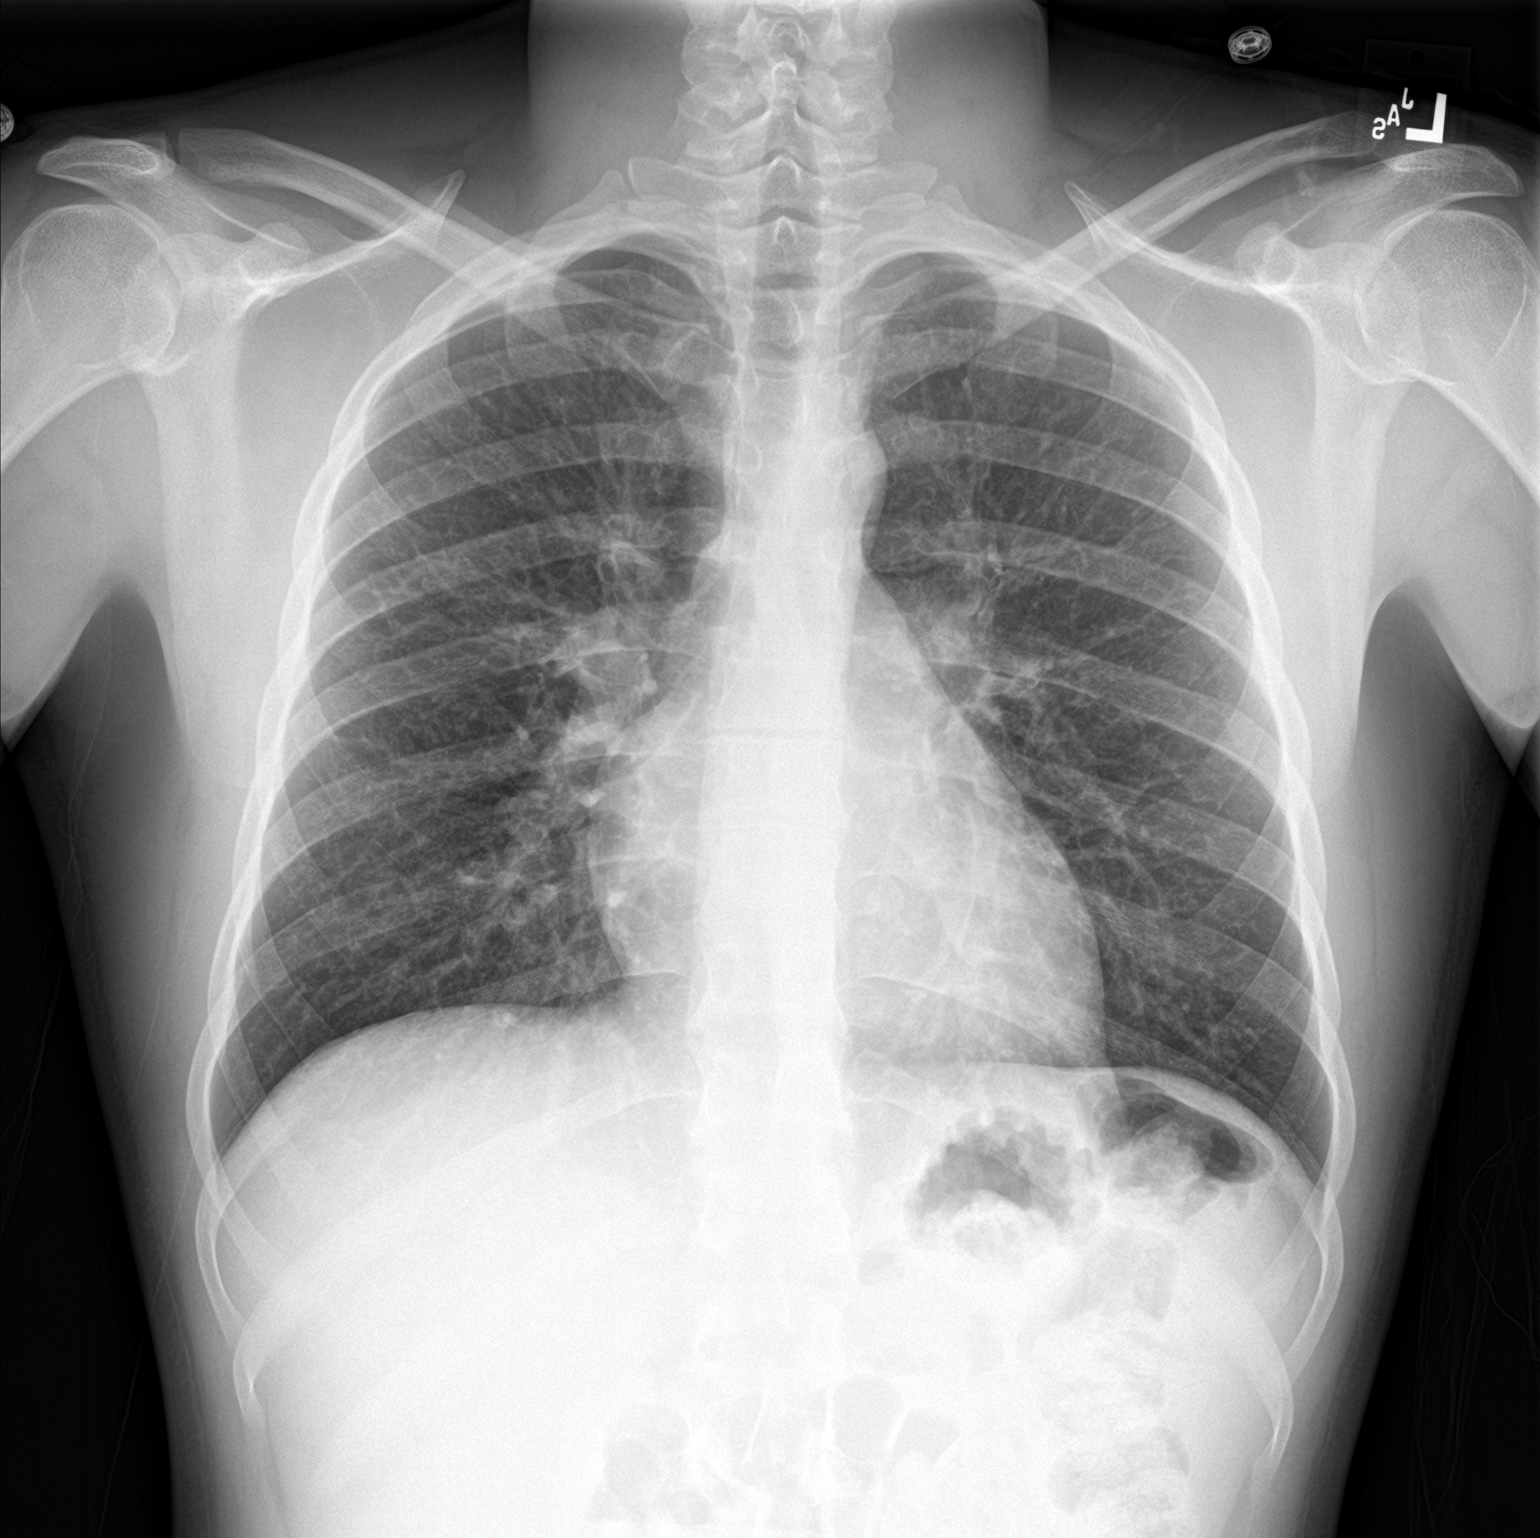

[chest lat]
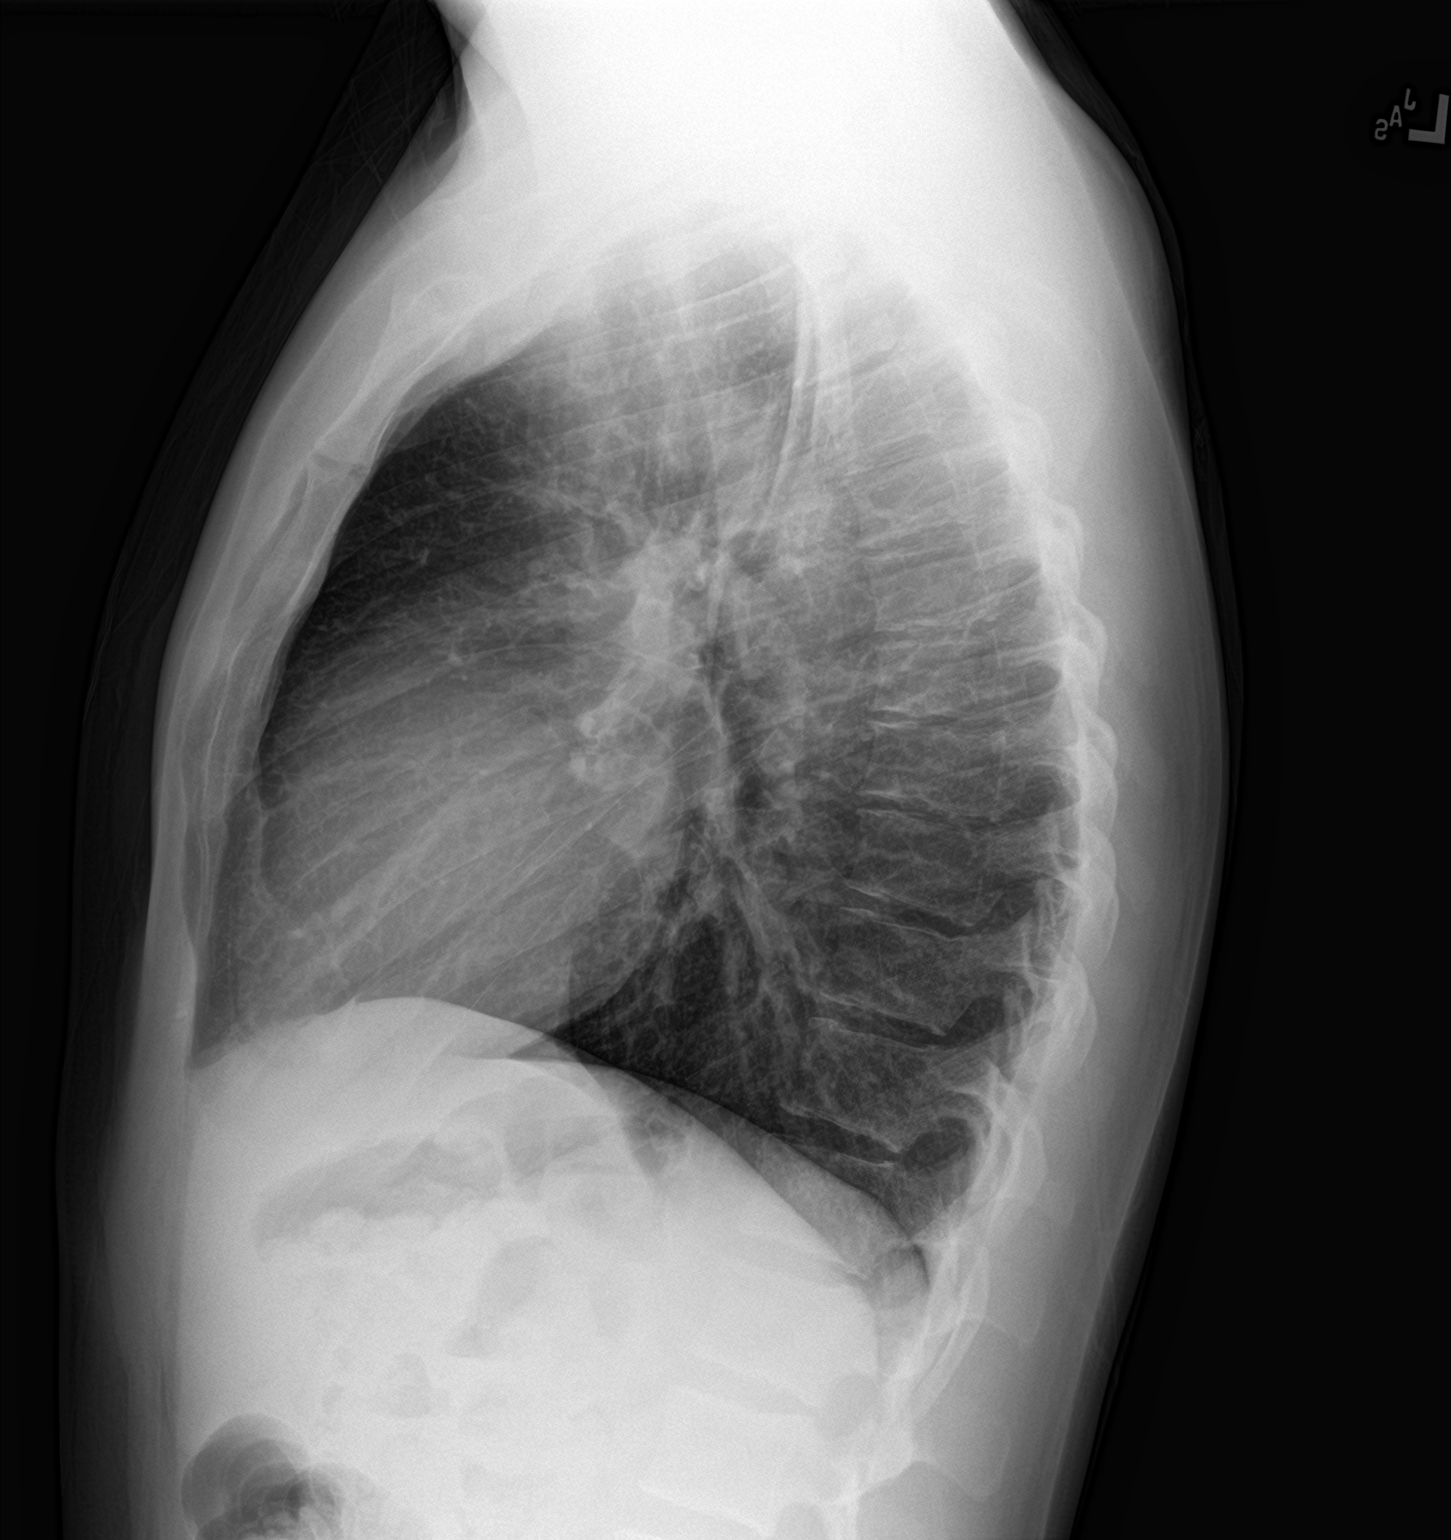

[2 of 2 positions shown; findings below may reference images not displayed]

FINDINGS: The heart size and mediastinal contours are within normal limits.
Both lungs are clear. The visualized skeletal structures are
unremarkable.
IMPRESSION: No active cardiopulmonary disease.
